# Patient Record
Sex: Female | Born: 1950 | Race: White | Hispanic: No | Marital: Married | Smoking: Never smoker
Health system: Southern US, Community
[De-identification: ages and names within clinical notes are randomized; demographics above are authoritative.]

## PROBLEM LIST (undated history)

## (undated) DIAGNOSIS — I10 Essential (primary) hypertension: Secondary | ICD-10-CM

## (undated) DIAGNOSIS — E079 Disorder of thyroid, unspecified: Secondary | ICD-10-CM

## (undated) DIAGNOSIS — E7800 Pure hypercholesterolemia, unspecified: Secondary | ICD-10-CM

## (undated) HISTORY — PX: BLADDER SUSPENSION: SHX72

## (undated) HISTORY — PX: KNEE SURGERY: SHX244

---

## 2004-12-25 DIAGNOSIS — E039 Hypothyroidism, unspecified: Secondary | ICD-10-CM | POA: Insufficient documentation

## 2004-12-25 DIAGNOSIS — I10 Essential (primary) hypertension: Secondary | ICD-10-CM | POA: Insufficient documentation

## 2005-09-23 DIAGNOSIS — Z8371 Family history of adenomatous and serrated polyps: Secondary | ICD-10-CM | POA: Insufficient documentation

## 2007-08-30 DIAGNOSIS — H5210 Myopia, unspecified eye: Secondary | ICD-10-CM | POA: Insufficient documentation

## 2007-08-30 DIAGNOSIS — H43819 Vitreous degeneration, unspecified eye: Secondary | ICD-10-CM | POA: Insufficient documentation

## 2012-11-08 DIAGNOSIS — Z96652 Presence of left artificial knee joint: Secondary | ICD-10-CM | POA: Insufficient documentation

## 2015-07-11 DIAGNOSIS — E7800 Pure hypercholesterolemia, unspecified: Secondary | ICD-10-CM | POA: Insufficient documentation

## 2018-02-12 ENCOUNTER — Emergency Department (HOSPITAL_BASED_OUTPATIENT_CLINIC_OR_DEPARTMENT_OTHER): Payer: Medicare Other

## 2018-02-12 ENCOUNTER — Emergency Department (HOSPITAL_BASED_OUTPATIENT_CLINIC_OR_DEPARTMENT_OTHER)
Admission: EM | Admit: 2018-02-12 | Discharge: 2018-02-12 | Disposition: A | Discharge status: Home / Self Care | Payer: Medicare Other | Attending: Emergency Medicine | Admitting: Emergency Medicine

## 2018-02-12 ENCOUNTER — Encounter (HOSPITAL_BASED_OUTPATIENT_CLINIC_OR_DEPARTMENT_OTHER): Payer: Self-pay | Admitting: Emergency Medicine

## 2018-02-12 ENCOUNTER — Other Ambulatory Visit: Payer: Self-pay

## 2018-02-12 DIAGNOSIS — Y998 Other external cause status: Secondary | ICD-10-CM | POA: Diagnosis not present

## 2018-02-12 DIAGNOSIS — S0990XA Unspecified injury of head, initial encounter: Secondary | ICD-10-CM | POA: Diagnosis not present

## 2018-02-12 DIAGNOSIS — Y92821 Forest as the place of occurrence of the external cause: Secondary | ICD-10-CM | POA: Diagnosis not present

## 2018-02-12 DIAGNOSIS — S40811A Abrasion of right upper arm, initial encounter: Secondary | ICD-10-CM | POA: Insufficient documentation

## 2018-02-12 DIAGNOSIS — Z79899 Other long term (current) drug therapy: Secondary | ICD-10-CM | POA: Insufficient documentation

## 2018-02-12 DIAGNOSIS — W01198A Fall on same level from slipping, tripping and stumbling with subsequent striking against other object, initial encounter: Secondary | ICD-10-CM | POA: Insufficient documentation

## 2018-02-12 DIAGNOSIS — S80211A Abrasion, right knee, initial encounter: Secondary | ICD-10-CM | POA: Diagnosis not present

## 2018-02-12 DIAGNOSIS — Y9301 Activity, walking, marching and hiking: Secondary | ICD-10-CM | POA: Diagnosis not present

## 2018-02-12 DIAGNOSIS — I10 Essential (primary) hypertension: Secondary | ICD-10-CM | POA: Diagnosis not present

## 2018-02-12 DIAGNOSIS — R1012 Left upper quadrant pain: Secondary | ICD-10-CM | POA: Diagnosis not present

## 2018-02-12 DIAGNOSIS — S12601A Unspecified nondisplaced fracture of seventh cervical vertebra, initial encounter for closed fracture: Secondary | ICD-10-CM

## 2018-02-12 DIAGNOSIS — T07XXXA Unspecified multiple injuries, initial encounter: Secondary | ICD-10-CM | POA: Diagnosis not present

## 2018-02-12 DIAGNOSIS — S199XXA Unspecified injury of neck, initial encounter: Secondary | ICD-10-CM | POA: Diagnosis present

## 2018-02-12 HISTORY — DX: Essential (primary) hypertension: I10

## 2018-02-12 HISTORY — DX: Pure hypercholesterolemia, unspecified: E78.00

## 2018-02-12 HISTORY — DX: Disorder of thyroid, unspecified: E07.9

## 2018-02-12 LAB — CBC WITH DIFFERENTIAL/PLATELET
BASOS PCT: 0 %
Basophils Absolute: 0 10*3/uL (ref 0.0–0.1)
EOS ABS: 0.2 10*3/uL (ref 0.0–0.7)
EOS PCT: 2 %
HCT: 43.2 % (ref 36.0–46.0)
Hemoglobin: 14.7 g/dL (ref 12.0–15.0)
LYMPHS ABS: 2.6 10*3/uL (ref 0.7–4.0)
Lymphocytes Relative: 31 %
MCH: 32 pg (ref 26.0–34.0)
MCHC: 34 g/dL (ref 30.0–36.0)
MCV: 94.1 fL (ref 78.0–100.0)
Monocytes Absolute: 0.6 10*3/uL (ref 0.1–1.0)
Monocytes Relative: 8 %
Neutro Abs: 4.8 10*3/uL (ref 1.7–7.7)
Neutrophils Relative %: 59 %
PLATELETS: 256 10*3/uL (ref 150–400)
RBC: 4.59 MIL/uL (ref 3.87–5.11)
RDW: 13.8 % (ref 11.5–15.5)
WBC: 8.2 10*3/uL (ref 4.0–10.5)

## 2018-02-12 LAB — BASIC METABOLIC PANEL
Anion gap: 6 (ref 5–15)
BUN: 13 mg/dL (ref 8–23)
CO2: 28 mmol/L (ref 22–32)
Calcium: 9.4 mg/dL (ref 8.9–10.3)
Chloride: 107 mmol/L (ref 98–111)
Creatinine, Ser: 0.82 mg/dL (ref 0.44–1.00)
Glucose, Bld: 95 mg/dL (ref 70–99)
POTASSIUM: 4.1 mmol/L (ref 3.5–5.1)
SODIUM: 141 mmol/L (ref 135–145)

## 2018-02-12 MED ORDER — IOPAMIDOL (ISOVUE-300) INJECTION 61%
100.0000 mL | Freq: Once | INTRAVENOUS | Status: AC | PRN
  Administered 2018-02-12: 100 mL via INTRAVENOUS

## 2018-02-12 MED ORDER — HYDROCODONE-ACETAMINOPHEN 5-325 MG PO TABS: 1.0000 | ORAL_TABLET | Freq: Four times a day (QID) | ORAL | 0 refills | Status: DC | PRN

## 2018-02-12 NOTE — Discharge Instructions (Signed)
Please wear the cervical collar until you are followed up by either neurosurgery or orthopedics.  You need to contact your primary care doctor when you get home and they will arrange for outpatient referral with neurosurgery or orthopedic for further evaluation.  You can take 1000 mg of Tylenol.  Do not exceed 4000 mg of Tylenol a day.  You can take the pain medication for severe or breakthrough pain.  This may make you drowsy.  Please do not drive or go to work while taking this medication.  As we discussed, a small nodule was found on your CT scan.  This will need follow-up by your primary care doctor and may need repeat imaging at some point.  Return the emergency department for any vision changes, numbness/weakness of your arms or legs, difficulty walking, vomiting, abdominal pain or any other worsening or concerning symptoms.

## 2018-02-12 NOTE — ED Notes (Signed)
ED Provider at bedside. 

## 2018-02-12 NOTE — ED Provider Notes (Signed)
[facility] EMERGENCY DEPARTMENT Provider Note   CSN: [id number] Arrival date & time: 02/12/18  1524     History   Chief Complaint Chief Complaint  Patient presents with  . Fall    HPI Sara Bautista is a 67 y.o. female possible history of hypercholesterolemia, hypertension who presents for evaluation after mechanical fall that occurred yesterday.  Patient reports that approximate 5:30 PM, she was walking in the woods at her [facility] house and states that she had a mechanical fall and tripped and fell.  Patient reports that the fall caused her to roll down a hill.  She does report that she hit her head on some railroad ties.  Patient reports that she was able to get up and ambulate immediately after the incident.  She denies any LOC.  Patient reports she was able to walk back to her house without any difficulty.  Patient states that after the incident, she started developing some neck and shoulder pain.  She states she has had a mild headache but has been taking ibuprofen with improvement.  Additionally, patient reports he had abrasions to her right upper extremity, right knee.  Patient reports that she took some ibuprofen with some improvement in her symptoms.  Patient reports that she called her primary care doctor today and was prompted to go to emergency department for evaluation of head injury.  Patient reports that while waiting in the ED, she started developing some left side pain.  She states that she has been able to eat and drink appropriately without any difficulty.  Patient reports she is on any blood thinners and she denies any loss of consciousness, vision change, vomiting, numbness/weakness of arms or legs, chest pain, difficulty breathing.  The history is provided by the patient.    Past Medical History:  Diagnosis Date  . Hypercholesterolemia   . Hypertension   . Thyroid disease     There are no active problems to display for this patient.   Past Surgical  History:  Procedure Laterality Date  . BLADDER SUSPENSION    . KNEE SURGERY       OB History   None      Home Medications    Prior to Admission medications   Medication Sig Start Date End Date Taking? Authorizing Provider  atenolol (TENORMIN) 25 MG tablet Take by mouth daily.   Yes [provider]  levothyroxine (SYNTHROID, LEVOTHROID) 25 MCG tablet Take 25 mcg by mouth daily before breakfast.   Yes [provider]  HYDROcodone-acetaminophen (NORCO/VICODIN) 5-325 MG tablet Take 1-2 tablets by mouth every 6 (six) hours as needed. 02/12/18   Maxwell Caul, PA-C    Family History History reviewed. No pertinent family history.  Social History Social History   Tobacco Use  . Smoking status: Never Smoker  . Smokeless tobacco: Never Used  Substance Use Topics  . Alcohol use: Never    Frequency: Never  . Drug use: Never     Allergies   Patient has no known allergies.   Review of Systems Review of Systems  Constitutional: Negative for fever.  Eyes: Negative for visual disturbance.  Respiratory: Negative for cough and shortness of breath.   Cardiovascular: Negative for chest pain.  Gastrointestinal: Negative for abdominal pain, nausea and vomiting.  Genitourinary: Negative for dysuria and hematuria.  Musculoskeletal: Positive for neck pain. Negative for back pain.       Left side pain  Neurological: Positive for headaches. Negative for weakness and numbness.  All other systems reviewed and are negative.    Physical Exam Updated Vital Signs BP (!) 146/84 (BP Location: Right Arm)   Pulse 75   Temp 98 F (36.7 C) (Oral)   Resp 18   Ht 5\' 1"  (1.549 m)   Wt 65.8 kg (145 lb)   SpO2 97%   BMI 27.40 kg/m   Physical Exam  Constitutional: She is oriented to person, place, and time. She appears well-developed and well-nourished.  HENT:  Head: Normocephalic and atraumatic. Head is without raccoon's eyes and without Battle's sign.  Right Ear:  Tympanic membrane normal. No hemotympanum.  Left Ear: Tympanic membrane normal. No hemotympanum.  Mouth/Throat: Oropharynx is clear and moist and mucous membranes are normal.  No tenderness to palpation of skull. No deformities or crepitus noted. No open wounds, abrasions or lacerations.  No evidence of hemotympanum, Battle's, raccoon's.  Eyes: Pupils are equal, round, and reactive to light. Conjunctivae, EOM and lids are normal.  Neck: Full passive range of motion without pain. Spinous process tenderness present. Decreased range of motion present.  There is palpation noted base of the neck.  Limited range of motion secondary to pain.  Most notably, she has pain with lateral movement of the neck.  Cardiovascular: Normal rate, regular rhythm, normal heart sounds and normal pulses. Exam reveals no gallop and no friction rub.  No murmur heard. Pulmonary/Chest: Effort normal and breath sounds normal.  Abdominal: Soft. Normal appearance. There is tenderness in the left upper quadrant. There is no rigidity and no guarding.  Abdomen is soft, nondistended.  No rigidity or guarding noted.  She does have some mild tenderness noted to left upper quadrant.  Musculoskeletal:  Tenderness palpation noted to the anterior lateral aspect of the right knee.  There is a small divot but otherwise no deformity or crepitus noted.  Flexion/extension of right knee intact without any difficulty.  Negative anterior posterior drawer test.  No instability noted on varus or valgus stress.  No tenderness palpation noted to tib-fib, ankle of right lower extremity.  No abnormalities of the left lower extremity.  Neurological: She is alert and oriented to person, place, and time.  Cranial nerves III-XII intact Follows commands, Moves all extremities  5/5 strength to BUE and BLE  Sensation intact throughout all major nerve distributions Normal finger to nose. No dysdiadochokinesia. No pronator drift. No gait abnormalities  No  slurred speech. No facial droop.   Skin: Skin is warm and dry. Capillary refill takes less than 2 seconds.  No ecchymosis noted to abdomen.  Psychiatric: She has a normal mood and affect. Her speech is normal.  Nursing note and vitals reviewed.    ED Treatments / Results  Labs (all labs ordered are listed, but only abnormal results are displayed) Labs Reviewed  BASIC METABOLIC PANEL  CBC WITH DIFFERENTIAL/PLATELET    EKG None  Radiology Dg Chest 2 View  Result Date: 02/12/2018 CLINICAL DATA:  Left lower rib pain after fall yesterday. EXAM: CHEST - 2 VIEW COMPARISON:  None. FINDINGS: The heart size and mediastinal contours are within normal limits. Normal pulmonary vascularity. Bibasilar linear scarring. No focal consolidation, pleural effusion, or pneumothorax. No acute osseous abnormality. IMPRESSION: No active cardiopulmonary disease. Electronically Signed   By: Obie Dredge M.D.   On: 02/12/2018 17:27   Ct Head Wo Contrast  Result Date: 02/12/2018 CLINICAL DATA:  Larey Seat down [place] yesterday. Trauma to the head and neck. Neck stiffness. EXAM: CT HEAD WITHOUT CONTRAST CT CERVICAL SPINE  WITHOUT CONTRAST TECHNIQUE: Multidetector CT imaging of the head and cervical spine was performed following the standard protocol without intravenous contrast. Multiplanar CT image reconstructions of the cervical spine were also generated. COMPARISON:  None. FINDINGS: CT HEAD FINDINGS Brain: The brain shows a normal appearance without evidence of malformation, atrophy, old or acute small or large vessel infarction, mass lesion, hemorrhage, hydrocephalus or extra-axial collection. Vascular: No hyperdense vessel. No evidence of atherosclerotic calcification. Skull: Normal.  No traumatic finding.  No focal bone lesion. Sinuses/Orbits: Sinuses are clear. Orbits appear normal. Mastoids are clear. Other: Left frontal scalp sebaceous cyst. CT CERVICAL SPINE FINDINGS Alignment: Normal. Skull base and  vertebrae: Nondisplaced fracture of the right lateral mass and lamina of C7. This appears to be a stable fracture. Soft tissues and spinal canal: No soft tissue finding of significance. Disc levels: Ordinary degenerative spondylosis at C4-5 and C5-6 but without significant canal or foraminal narrowing. Upper chest: Negative Other: None IMPRESSION: Head CT: Normal. Cervical spine CT: Nondisplaced fracture of the right C7 lateral mass and lamina. This would appear to be a stable fracture. Electronically Signed   By: Paulina FusiMark  Shogry M.D.   On: 02/12/2018 17:06   Ct Cervical Spine Wo Contrast  Result Date: 02/12/2018 CLINICAL DATA:  [place] yesterday. Trauma to the head and neck. Neck stiffness. EXAM: CT HEAD WITHOUT CONTRAST CT CERVICAL SPINE WITHOUT CONTRAST TECHNIQUE: Multidetector CT imaging of the head and cervical spine was performed following the standard protocol without intravenous contrast. Multiplanar CT image reconstructions of the cervical spine were also generated. COMPARISON:  None. FINDINGS: CT HEAD FINDINGS Brain: The brain shows a normal appearance without evidence of malformation, atrophy, old or acute small or large vessel infarction, mass lesion, hemorrhage, hydrocephalus or extra-axial collection. Vascular: No hyperdense vessel. No evidence of atherosclerotic calcification. Skull: Normal.  No traumatic finding.  No focal bone lesion. Sinuses/Orbits: Sinuses are clear. Orbits appear normal. Mastoids are clear. Other: Left frontal scalp sebaceous cyst. CT CERVICAL SPINE FINDINGS Alignment: Normal. Skull base and vertebrae: Nondisplaced fracture of the right lateral mass and lamina of C7. This appears to be a stable fracture. Soft tissues and spinal canal: No soft tissue finding of significance. Disc levels: Ordinary degenerative spondylosis at C4-5 and C5-6 but without significant canal or foraminal narrowing. Upper chest: Negative Other: None IMPRESSION: Head CT: Normal. Cervical spine  CT: Nondisplaced fracture of the right C7 lateral mass and lamina. This would appear to be a stable fracture. Electronically Signed   By: Paulina FusiMark  Shogry M.D.   On: 02/12/2018 17:06   Ct Abdomen Pelvis W Contrast  Result Date: 02/12/2018 CLINICAL DATA:  Trauma yesterday. Left-sided abdominal pain. C7 fracture. EXAM: CT ABDOMEN AND PELVIS WITH CONTRAST TECHNIQUE: Multidetector CT imaging of the abdomen and pelvis was performed using the standard protocol following bolus administration of intravenous contrast. CONTRAST:  100mL ISOVUE-300 IOPAMIDOL (ISOVUE-300) INJECTION 61% COMPARISON:  None. FINDINGS: Lower chest: Bibasilar scarring. 3 mm right lower lobe pulmonary nodules x2, including on 16/4. Normal heart size without pericardial or pleural effusion. Hepatobiliary: Segment 4 B well-circumscribed low-density lesion is likely a cyst. Normal gallbladder, without biliary ductal dilatation. Pancreas: Normal, without mass or ductal dilatation. Spleen: Normal in size, without focal abnormality. Adrenals/Urinary Tract: Mild left adrenal thickening. Normal right adrenal gland. An interpolar left renal too small to characterize lesion. Normal right kidney. Normal urinary bladder. Stomach/Bowel: Normal stomach, without wall thickening. Colonic stool burden suggests constipation. Normal small bowel. Vascular/Lymphatic: Aortic and branch vessel atherosclerosis. Prominent  gonadal veins, greater on the left. No abdominopelvic adenopathy. Reproductive: Retroverted uterus. Suspect residual follicle in the left ovary at 1.5 cm. Other: No significant free fluid. Mild pelvic floor laxity. No free intraperitoneal air. Musculoskeletal: Mild osteopenia. Grade 1 L4-5 anterolisthesis. Convex left lumbar spine curvature is mild. IMPRESSION: 1. No acute process in the abdomen or pelvis. No posttraumatic deformity identified. 2. 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 3.  Aortic Atherosclerosis  (ICD10-I70.0). 4. Right lung base nodules 3 mm. No follow-up needed if patient is low-risk. Non-contrast chest CT can be considered in 12 months if patient is high-risk. This recommendation follows the consensus statement: Guidelines for Management of Incidental Pulmonary Nodules Detected on CT Images: From the Fleischner Society 2017; Radiology 2017; 284:228-243. Electronically Signed   By: Jeronimo Greaves M.D.   On: 02/12/2018 19:55   Dg Knee Complete 4 Views Right  Result Date: 02/12/2018 CLINICAL DATA:  Right knee pain after fall yesterday. EXAM: RIGHT KNEE - COMPLETE 4+ VIEW COMPARISON:  None. FINDINGS: No acute fracture or dislocation. Trace suprapatellar joint effusion. Joint spaces are preserved. Tiny patellar osteophytes. Soft tissues are unremarkable. IMPRESSION: 1. No acute osseous abnormality. Trace suprapatellar joint effusion. Electronically Signed   By: Obie Dredge M.D.   On: 02/12/2018 17:29    Procedures Procedures (including critical care time)  Medications Ordered in ED Medications  iopamidol (ISOVUE-300) 61 % injection 100 mL (100 mLs Intravenous Contrast Given 02/12/18 1913)     Initial Impression / Assessment and Plan / ED Course  I have reviewed the triage vital signs and the nursing notes.  Pertinent labs & imaging results that were available during my care of the patient were reviewed by me and considered in my medical decision making (see chart for details).     67 year old female who presents for evaluation after mechanical fall that occurred approximately 5:30 PM yesterday.  [place] states she lost her footing, causing her to fall.  States she hit her head on railroad ties.  No LOC.  Was able to ambulate and walk back to the house.  Since then has had some neck pain, generalized headache.  Called her primary care doctor who advised coming to the ED.  Patient is not on blood thinners.  No LOC, vomiting, vision changes. Patient is  afebrile, non-toxic appearing, sitting comfortably on examination table. Vital signs reviewed and stable. No neuro deficits noted on exam.  Patient does have some mild tenderness palpation noted to the base of the C-spine.  She does have some pain with lateral movement of the neck.  No deformities or crepitus noted.  Given reassuring physical exam and per [facility] CT criteria, no imaging is indicated at this time.  I discussed this with patient but but through our discussion, patient states that she was concerned and would be worried about this.  She states that she is scheduled to travel back home this week and would feel more reassured if she had a head CT.  I feel this is reasonable.  Will also plan for x-ray of the knee.  X-ray of knee shows a trace suprapatellar joint effusion.  Otherwise no acute bony abnormality.  Chest x-ray negative for any acute bony abnormality.  CT C-spine shows a nondisplaced C7 fracture of the lateral mass and lamina.  Appears to be stable.  Head CT is unremarkable.  Will plan to apply Aspen collar here in the ED.  At this time, this does appear to be a stable fracture.  Will likely need outpatient neurosurgery or orthopedic follow-up.  Given that she was having some left upper quadrant abdominal tenderness and the fact that she had enough force of trauma that she broke a bone in her neck, will plan to proceed with CT abdomen pelvis for evaluation of any potential intra-abdominal injury given tenderness.  Updated patient on results.  She is agreeable to plan.   CT abdomen pelvis reviewed.  No posttraumatic abnormality noted.  She does have some prominent gonadal veins and a right lung nodule at the base measured 3 mm.  Will likely need to follow-up from primary care doctor.  Discussed results with patient and family.  Patient reports she still slightly sore but states the pain has improved.  Patient with Aspen collar in place.  Patient is from out of town and will be  returning home in 2 days.  Encourage her to follow-up with neurosurgery or orthopedic surgery when she gets home.  Discussed at home safety precautions with patient. Patient had ample opportunity for questions and discussion. All patient's questions were answered with full understanding. Strict return precautions discussed. Patient expresses understanding and agreement to plan.   Final Clinical Impressions(s) / ED Diagnoses   Final diagnoses:  Closed nondisplaced fracture of seventh cervical vertebra, unspecified fracture morphology, initial encounter (HCC)  Abrasions of multiple sites    ED Discharge Orders        Ordered    HYDROcodone-acetaminophen (NORCO/VICODIN) 5-325 MG tablet  Every 6 hours PRN     02/12/18 2030       Maxwell Caul, PA-C 02/12/18 2216    Vanetta Mulders, MD 02/12/18 [phone]

## 2018-02-12 NOTE — ED Provider Notes (Signed)
Medical screening examination/treatment/procedure(s) were conducted as a shared visit with non-physician practitioner(s) and myself.  I personally evaluated the patient during the encounter.  None   Patient status post fall yesterday about 5:30 in the evening she rolled down a hill landing on her head initially and did strike her head on the rare road on the way down.  Patient she continues to have some neck pain she feels is a little bit better than it was yesterday.  And about an hour ago patient started to have left lower anterior rib pain and some mild abdominal pain.  CT  CT head and neck showed a stable C7 fracture.  Head was negative otherwise.  Based on this we knew there is a fairly significant fall so we proceeded to do CT abdomen and pelvis to rule out any kind of splenic injury.  Patient hemodynamically stable but does have some mild tenderness in the left anterior rib area and a little bit in the abdomen area.  If CT abdomen pelvis is negative patient stable for discharge with an Aspen collar.  [place] follow-up with neurosurgery up there.  Patient is currently here visiting her daughter.  She is planning to return to [place] on Tuesday.   Patient without any neuro deficits.      Vanetta MuldersZackowski, Chevis Weisensel, MD 02/12/18 [phone]

## 2018-02-12 NOTE — ED Triage Notes (Addendum)
Patient states that she fell yesterday at about 4530 - The patient states that she rolled down a hill and hit her head on some railroad ties. The patient states that she feels  Better today but reports that she continues to have some neck pain  - patient states that about 1 hour ago she started to have left lower rib pain and mid abdominal pain around her hip region

## 2018-02-12 NOTE — ED Notes (Signed)
Patient transported to X-ray 

## 2018-02-15 DIAGNOSIS — R918 Other nonspecific abnormal finding of lung field: Secondary | ICD-10-CM | POA: Insufficient documentation

## 2018-02-15 DIAGNOSIS — I700 Atherosclerosis of aorta: Secondary | ICD-10-CM | POA: Insufficient documentation

## 2018-02-15 DIAGNOSIS — M8580 Other specified disorders of bone density and structure, unspecified site: Secondary | ICD-10-CM | POA: Insufficient documentation

## 2018-08-28 DIAGNOSIS — Z87440 Personal history of urinary (tract) infections: Secondary | ICD-10-CM | POA: Insufficient documentation

## 2019-08-20 IMAGING — CT CT CERVICAL SPINE W/O CM
4 of 7 series · 14 of 33 positions shown, 15 images · non-contrast
Comparison: None.

CLINICAL DATA: Fell down [address] yesterday. Trauma to the head
and neck. Neck stiffness.

EXAM:
CT HEAD WITHOUT CONTRAST
CT CERVICAL SPINE WITHOUT CONTRAST
TECHNIQUE: Multidetector CT imaging of the head and cervical spine was
performed following the standard protocol without intravenous
contrast. Multiplanar CT image reconstructions of the cervical spine
were also generated.

[Series 7: c_spine 2.0 i30s 3 · axial · 0.29mm/px · z∈[-281,-171]mm · 4 of 93 slices shown, 5 images]
[im 19/93  soft-tissue]
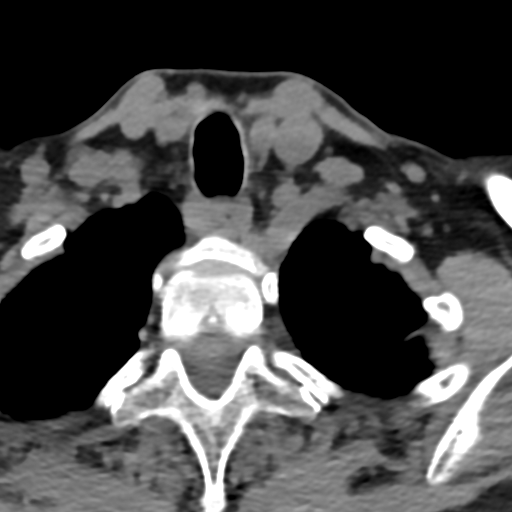
[im 19/93  bone]
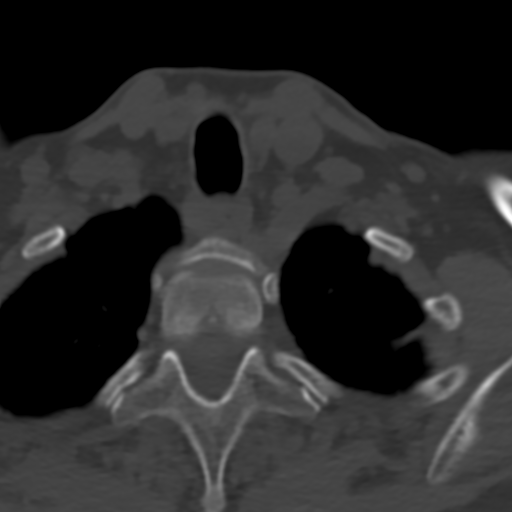
[im 37/93  bone]
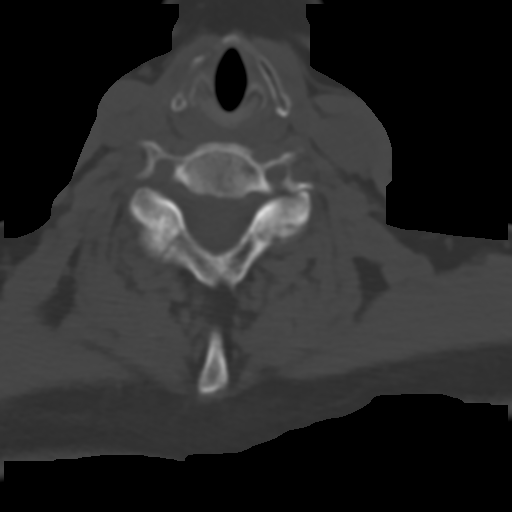
[im 56/93  bone]
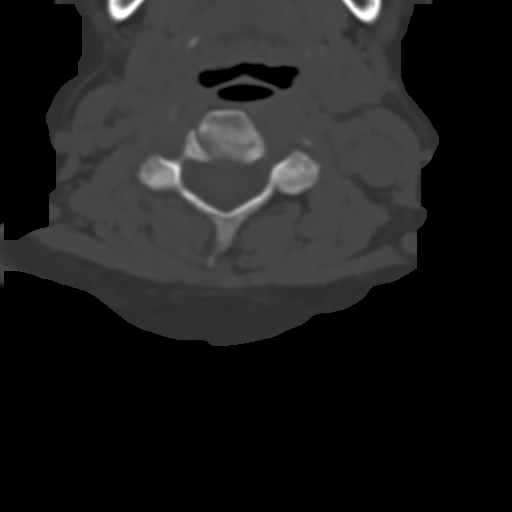
[im 74/93  bone]
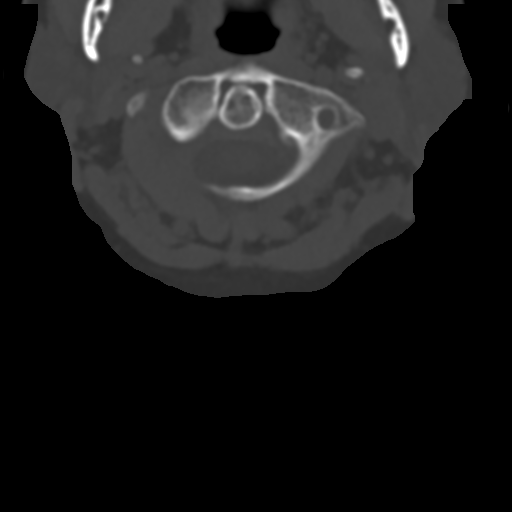

[Series 9: coronals · coronal · 0.25mm/px · 1 of 49 slices shown]
[im 25/49  bone]
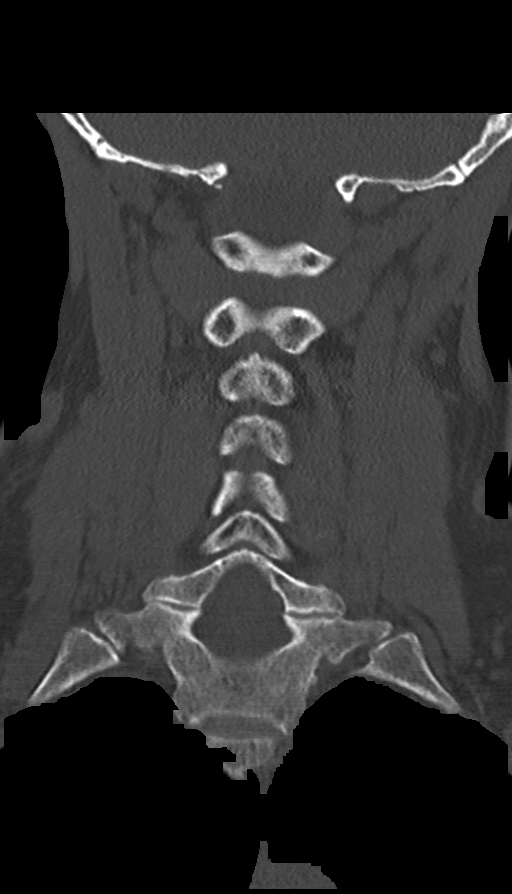

[Series 10: sagittals · sagittal · 0.27mm/px · 5 of 61 slices shown]
[im 11/61  bone]
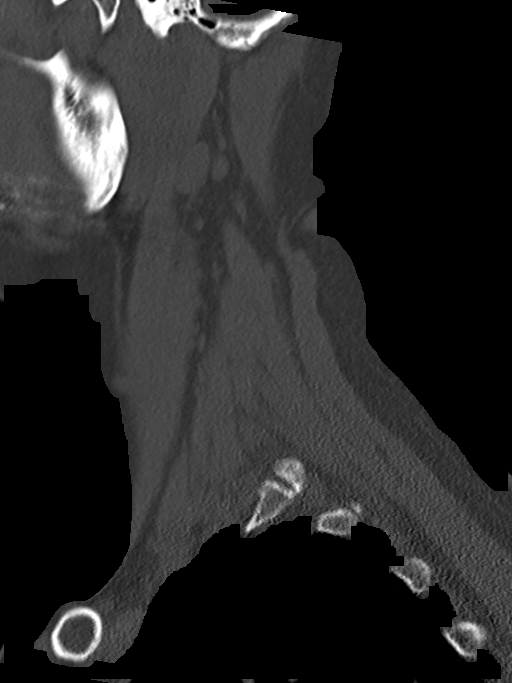
[im 21/61  bone]
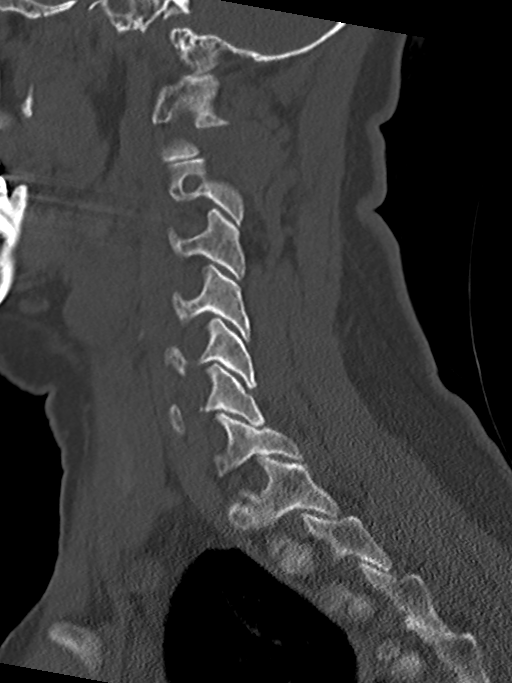
[im 31/61  bone]
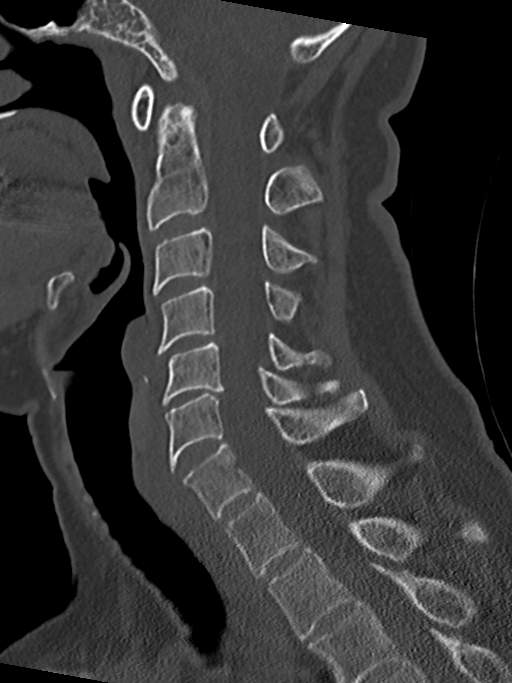
[im 41/61  bone]
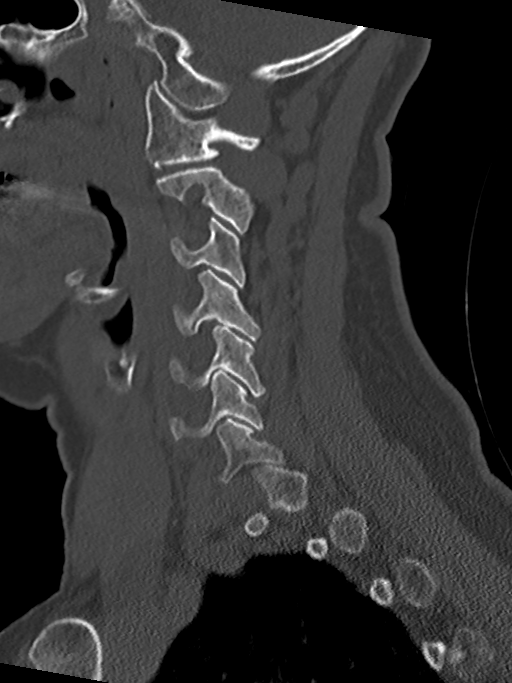
[im 51/61  bone]
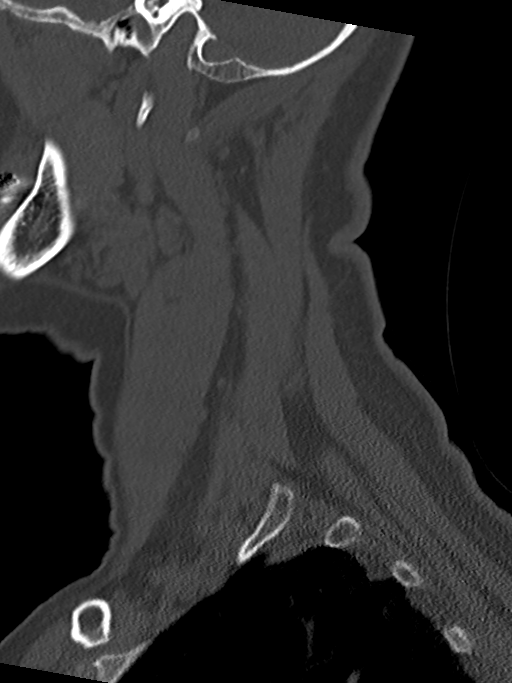

[Series 11: orthogonals · axial · 0.34mm/px · z∈[-279,-176]mm · 4 of 88 slices shown]
[im 18/88  bone]
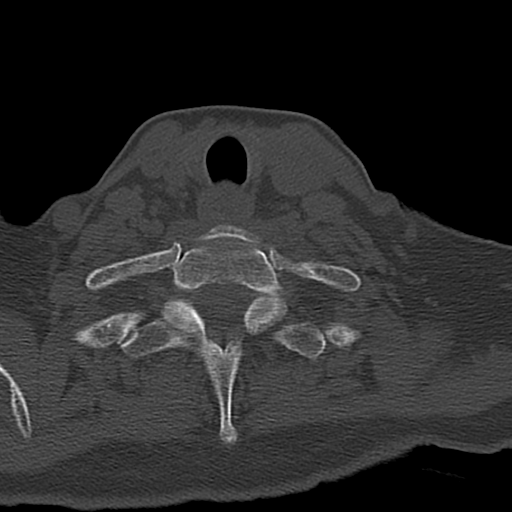
[im 35/88  bone]
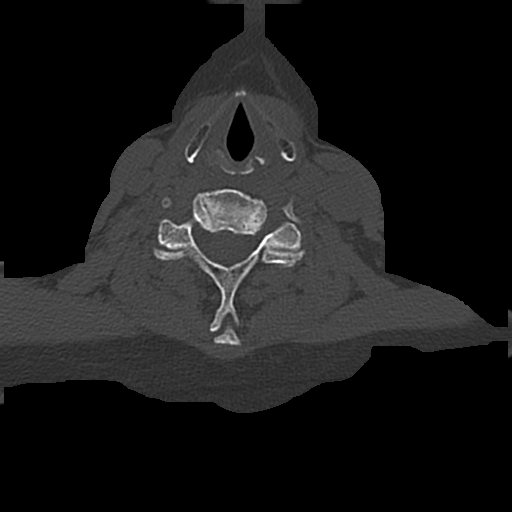
[im 53/88  bone]
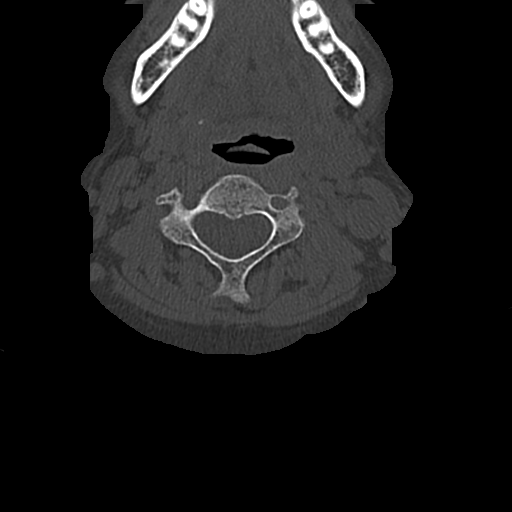
[im 70/88  bone]
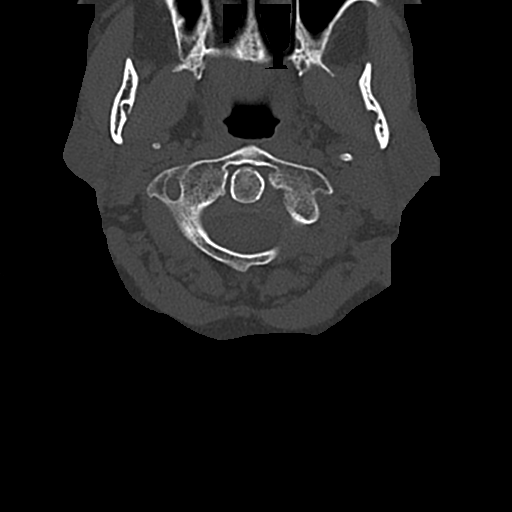

[14 of 33 positions shown; findings below may reference images not displayed]

FINDINGS: CT HEAD FINDINGS

Brain: The brain shows a normal appearance without evidence of
malformation, atrophy, old or acute small or large vessel
infarction, mass lesion, hemorrhage, hydrocephalus or extra-axial
collection.

Vascular: No hyperdense vessel. No evidence of atherosclerotic
calcification.

Skull: Normal.  No traumatic finding.  No focal bone lesion.

Sinuses/Orbits: Sinuses are clear. Orbits appear normal. Mastoids
are clear.

Other: Left frontal scalp sebaceous cyst.

CT CERVICAL SPINE FINDINGS

Alignment: Normal.

Skull base and vertebrae: Nondisplaced fracture of the right lateral
mass and lamina of C7. This appears to be a stable fracture.

Soft tissues and spinal canal: No soft tissue finding of
significance.

Disc levels: Ordinary degenerative spondylosis at C4-5 and C5-6 but
without significant canal or foraminal narrowing.

Upper chest: Negative

Other: None
IMPRESSION: Head CT: Normal.

Cervical spine CT: Nondisplaced fracture of the right C7 lateral
mass and lamina. This would appear to be a stable fracture.

## 2021-04-29 DIAGNOSIS — M170 Bilateral primary osteoarthritis of knee: Secondary | ICD-10-CM | POA: Insufficient documentation

## 2021-07-09 DIAGNOSIS — K649 Unspecified hemorrhoids: Secondary | ICD-10-CM | POA: Insufficient documentation

## 2021-09-08 DIAGNOSIS — Z130 Encounter for screening for diseases of the blood and blood-forming organs and certain disorders involving the immune mechanism: Secondary | ICD-10-CM | POA: Insufficient documentation

## 2021-12-06 DIAGNOSIS — H9193 Unspecified hearing loss, bilateral: Secondary | ICD-10-CM | POA: Insufficient documentation

## 2021-12-06 DIAGNOSIS — M5417 Radiculopathy, lumbosacral region: Secondary | ICD-10-CM | POA: Insufficient documentation

## 2021-12-06 DIAGNOSIS — M9989 Other biomechanical lesions of abdomen and other regions: Secondary | ICD-10-CM | POA: Insufficient documentation

## 2021-12-06 DIAGNOSIS — M1711 Unilateral primary osteoarthritis, right knee: Secondary | ICD-10-CM | POA: Insufficient documentation

## 2021-12-06 DIAGNOSIS — M47817 Spondylosis without myelopathy or radiculopathy, lumbosacral region: Secondary | ICD-10-CM | POA: Insufficient documentation

## 2021-12-06 DIAGNOSIS — Z8669 Personal history of other diseases of the nervous system and sense organs: Secondary | ICD-10-CM | POA: Insufficient documentation

## 2021-12-06 DIAGNOSIS — D229 Melanocytic nevi, unspecified: Secondary | ICD-10-CM | POA: Insufficient documentation

## 2021-12-06 DIAGNOSIS — G518 Other disorders of facial nerve: Secondary | ICD-10-CM | POA: Insufficient documentation

## 2021-12-06 DIAGNOSIS — Z9889 Other specified postprocedural states: Secondary | ICD-10-CM | POA: Insufficient documentation

## 2021-12-06 DIAGNOSIS — M48061 Spinal stenosis, lumbar region without neurogenic claudication: Secondary | ICD-10-CM | POA: Insufficient documentation

## 2022-05-03 DIAGNOSIS — G510 Bell's palsy: Secondary | ICD-10-CM | POA: Insufficient documentation

## 2022-05-14 DIAGNOSIS — M48062 Spinal stenosis, lumbar region with neurogenic claudication: Secondary | ICD-10-CM | POA: Insufficient documentation

## 2022-05-18 DIAGNOSIS — Z9889 Other specified postprocedural states: Secondary | ICD-10-CM | POA: Insufficient documentation

## 2022-12-17 ENCOUNTER — Ambulatory Visit (INDEPENDENT_AMBULATORY_CARE_PROVIDER_SITE_OTHER): Payer: Medicare (Managed Care)

## 2022-12-17 ENCOUNTER — Ambulatory Visit
Admission: EM | Admit: 2022-12-17 | Discharge: 2022-12-17 | Disposition: A | Discharge status: Home / Self Care | Payer: Medicare (Managed Care)

## 2022-12-17 DIAGNOSIS — M25531 Pain in right wrist: Secondary | ICD-10-CM | POA: Diagnosis not present

## 2022-12-17 NOTE — ED Provider Notes (Signed)
[facility]    CSN: [id number] Arrival date & time: 12/17/22  1326      History   Chief Complaint Chief Complaint  Patient presents with   Wrist Pain    Think a week old sprain - Entered by patient    HPI Sara Bautista is a 72 y.o. female.   HPI  She is in today with right wrist pain.  She reports that she fell approximately 9 days ago.  She reports walking down some steps and slipped on an old mat. She did have some bruising and swelling.  She feels like it has gotten however she continues to have the pain.  She reports that she is getting ready to go on vacation and would like to have this evaluated before leaving.  She has noted some pain that radiates into her arm and occasionally into her right shoulder.  She does have range of motion but some weakness to the shoulder. Past Medical History:  Diagnosis Date   Hypercholesterolemia    Hypertension    Thyroid disease     Patient Active Problem List   Diagnosis Date Noted   Hx of lumbosacral spine surgery 05/18/2022   Lumbar stenosis with neurogenic claudication 05/14/2022   Left-sided Bell's palsy 05/03/2022   Degenerative lumbar spinal stenosis 12/06/2021   Lumbosacral radiculopathy 12/06/2021   Lumbosacral spondylosis 12/06/2021   Melanocytic nevus 12/06/2021   Osteoarthritis of right knee 12/06/2021   Other biomechanical lesions of abdomen and other regions 12/06/2021   Other disorders of facial nerve 12/06/2021   Other specified postprocedural states 12/06/2021   Personal history of other diseases of the nervous system and sense organs 12/06/2021   Unspecified hearing loss, bilateral 12/06/2021   Screening for disorder of blood and blood-forming organs 09/08/2021   Unspecified hemorrhoids 07/09/2021   Primary osteoarthritis of both knees 04/29/2021   Personal history of urinary (tract) infections 08/28/2018   Hardening of the aorta (main artery of the heart) (HCC) 02/15/2018   Multiple pulmonary nodules  02/15/2018   Osteopenia 02/15/2018   Hypercholesterolemia 07/11/2015   Presence of left artificial knee joint 11/08/2012   Myopia, unspecified eye 08/30/2007   Vitreous degeneration, unspecified eye 08/30/2007   Family history of adenomatous and serrated polyps 09/23/2005   Essential (primary) hypertension 12/25/2004   Hypothyroidism 12/25/2004    Past Surgical History:  Procedure Laterality Date   BLADDER SUSPENSION     KNEE SURGERY      OB History   No obstetric history on file.      Home Medications    Prior to Admission medications   Medication Sig Start Date End Date Taking? Authorizing Provider  atorvastatin (LIPITOR) 20 MG tablet Take by mouth. 08/18/22  Yes [provider]  losartan (COZAAR) 25 MG tablet Take by mouth. 08/18/22 08/18/23 Yes [provider]  levothyroxine (SYNTHROID, LEVOTHROID) 25 MCG tablet Take 25 mcg by mouth daily before breakfast.    [provider]    Family History History reviewed. No pertinent family history.  Social History Social History   Tobacco Use   Smoking status: Never   Smokeless tobacco: Never  Vaping Use   Vaping Use: Never used  Substance Use Topics   Alcohol use: Never   Drug use: Never     Allergies   Oxycodone   Review of Systems Review of Systems   Physical Exam Triage Vital Signs ED Triage Vitals  Enc Vitals Group     BP 12/17/22 1339 (!) 142/73  Pulse Rate 12/17/22 1339 77     Resp 12/17/22 1339 16     Temp 12/17/22 1339 97.9 F (36.6 C)     Temp Source 12/17/22 1339 Oral     SpO2 12/17/22 1339 95 %     Weight --      Height --      Head Circumference --      Peak Flow --      Pain Score 12/17/22 1342 6     Pain Loc --      Pain Edu? --      Excl. in GC? --    No data found.  Updated Vital Signs BP (!) 142/73 (BP Location: Right Arm)   Pulse 77   Temp 97.9 F (36.6 C) (Oral)   Resp 16   SpO2 95%   Visual Acuity Right Eye Distance:   Left Eye  Distance:   Bilateral Distance:    Right Eye Near:   Left Eye Near:    Bilateral Near:     Physical Exam Constitutional:      General: She is not in acute distress.    Appearance: She is normal weight. She is not ill-appearing.  HENT:     Head: Normocephalic.     Nose: Nose normal.     Mouth/Throat:     Mouth: Mucous membranes are moist.  Cardiovascular:     Rate and Rhythm: Normal rate.  Pulmonary:     Effort: Pulmonary effort is normal.  Musculoskeletal:     Right forearm: Normal.     Left forearm: Normal.     Right wrist: Swelling and tenderness present. No snuff box tenderness or crepitus. Decreased range of motion.     Left wrist: Normal.  Skin:    General: Skin is warm and dry.     Capillary Refill: Capillary refill takes less than 2 seconds.  Neurological:     Mental Status: She is alert and oriented to person, place, and time.  Psychiatric:        Behavior: Behavior normal.      UC Treatments / Results  Labs (all labs ordered are listed, but only abnormal results are displayed) Labs Reviewed - No data to display  EKG   Radiology DG Wrist Complete Right  Result Date: 12/17/2022 CLINICAL DATA:  Fall.  Wrist pain. EXAM: RIGHT WRIST - COMPLETE 3+ VIEW COMPARISON:  None Available. FINDINGS: The bones are demineralized. There is no evidence of acute fracture or dislocation. There are mild radiocarpal, intercarpal and 1st carpometacarpal degenerative changes. Possible mild soft tissue swelling around the wrist without evidence of foreign body. IMPRESSION: No evidence of acute fracture or dislocation. Possible mild soft tissue swelling. Electronically Signed   By: Carey Bullocks M.D.   On: 12/17/2022 14:27    Procedures Procedures (including critical care time)  Medications Ordered in UC Medications - No data to display  Initial Impression / Assessment and Plan / UC Course  I have reviewed the triage vital signs and the nursing notes.  Pertinent labs &  imaging results that were available during my care of the patient were reviewed by me and considered in my medical decision making (see chart for details).     Right wrist pain Final Clinical Impressions(s) / UC Diagnoses   Final diagnoses:  Right wrist pain     Discharge Instructions      Overall your x-ray No evidence of acute fracture or dislocation. Possible mild soft tissue swelling. We do  encourage you to follow up with your primary care provider if your symptoms persist for further evaluation with additional imaging. We do recommend that you use over the counter Tylenol or Ibuprofen as directed (do not exceed daily limits).   You can also wear an Ace wrap along with icing the area, leave the ice on for 15 minutes, 2-3 times a day and keeping the wrist elevated will help with the swelling.      ED Prescriptions   None    PDMP not reviewed this encounter.   Thad [place], NP 12/17/22 1447

## 2022-12-17 NOTE — ED Triage Notes (Signed)
Patient presents to [facility] for right wrist pain. States she fell catching herself x 9 days ago. Continued pain, has used an ankle brace for relief which helped a little. She was taking tylenol and ibuprofen, last dose 2 days ago.

## 2022-12-17 NOTE — Discharge Instructions (Addendum)
Overall your x-ray No evidence of acute fracture or dislocation. Possible mild soft tissue swelling. We do encourage you to follow up with your primary care provider if your symptoms persist for further evaluation with additional imaging. We do recommend that you use over the counter Tylenol or Ibuprofen as directed (do not exceed daily limits).   You can also wear an Ace wrap along with icing the area, leave the ice on for 15 minutes, 2-3 times a day and keeping the wrist elevated will help with the swelling.
